# Patient Record
Sex: Female | Born: 1970 | Race: White | Hispanic: No | Marital: Married | Smoking: Never smoker
Health system: Southern US, Community
[De-identification: ages and names within clinical notes are randomized; demographics above are authoritative.]

## PROBLEM LIST (undated history)

## (undated) DIAGNOSIS — E611 Iron deficiency: Secondary | ICD-10-CM

## (undated) DIAGNOSIS — E876 Hypokalemia: Secondary | ICD-10-CM

## (undated) DIAGNOSIS — E119 Type 2 diabetes mellitus without complications: Secondary | ICD-10-CM

## (undated) HISTORY — DX: Type 2 diabetes mellitus without complications: E11.9

---

## 2019-06-08 ENCOUNTER — Encounter (HOSPITAL_COMMUNITY): Payer: Self-pay | Admitting: Emergency Medicine

## 2019-06-08 ENCOUNTER — Emergency Department (HOSPITAL_COMMUNITY): Payer: Self-pay

## 2019-06-08 ENCOUNTER — Other Ambulatory Visit: Payer: Self-pay

## 2019-06-08 ENCOUNTER — Emergency Department (HOSPITAL_COMMUNITY)
Admission: EM | Admit: 2019-06-08 | Discharge: 2019-06-08 | Disposition: A | Discharge status: Home / Self Care | Payer: Self-pay | Attending: Emergency Medicine | Admitting: Emergency Medicine

## 2019-06-08 DIAGNOSIS — R079 Chest pain, unspecified: Secondary | ICD-10-CM

## 2019-06-08 DIAGNOSIS — R0789 Other chest pain: Secondary | ICD-10-CM | POA: Insufficient documentation

## 2019-06-08 LAB — BASIC METABOLIC PANEL
Anion gap: 13 (ref 5–15)
BUN: 11 mg/dL (ref 6–20)
CO2: 21 mmol/L — ABNORMAL LOW (ref 22–32)
Calcium: 9.1 mg/dL (ref 8.9–10.3)
Chloride: 102 mmol/L (ref 98–111)
Creatinine, Ser: 0.52 mg/dL (ref 0.44–1.00)
GFR calc Af Amer: >60 mL/min (ref >60)
GFR calc non Af Amer: >60 mL/min (ref >60)
Glucose, Bld: 194 mg/dL — ABNORMAL HIGH (ref 70–99)
Potassium: 3.8 mmol/L (ref 3.5–5.1)
Sodium: 136 mmol/L (ref 135–145)

## 2019-06-08 LAB — CBC
HCT: 43.5 % (ref 36.0–46.0)
Hemoglobin: 13 g/dL (ref 12.0–15.0)
MCH: 26.6 pg (ref 26.0–34.0)
MCHC: 29.9 g/dL — ABNORMAL LOW (ref 30.0–36.0)
MCV: 89.1 fL (ref 80.0–100.0)
Platelets: 291 10*3/uL (ref 150–400)
RBC: 4.88 MIL/uL (ref 3.87–5.11)
RDW: 14.6 % (ref 11.5–15.5)
WBC: 12.5 10*3/uL — ABNORMAL HIGH (ref 4.0–10.5)
nRBC: 0 % (ref 0.0–0.2)

## 2019-06-08 LAB — HCG, SERUM, QUALITATIVE: Preg, Serum: NEGATIVE

## 2019-06-08 LAB — TROPONIN I (HIGH SENSITIVITY)
Troponin I (High Sensitivity): <2 ng/L (ref <18)
Troponin I (High Sensitivity): <2 ng/L (ref <18)

## 2019-06-08 LAB — D-DIMER, QUANTITATIVE: D-Dimer, Quant: 0.51 ug/mL-FEU — ABNORMAL HIGH (ref 0.00–0.50)

## 2019-06-08 MED ORDER — IOHEXOL 350 MG/ML SOLN
100.0000 mL | Freq: Once | INTRAVENOUS | Status: AC | PRN
  Administered 2019-06-08: 100 mL via INTRAVENOUS

## 2019-06-08 MED ORDER — NAPROXEN 250 MG PO TABS
500.0000 mg | ORAL_TABLET | Freq: Once | ORAL | Status: AC
  Administered 2019-06-08: 500 mg via ORAL
  Filled 2019-06-08: qty 2

## 2019-06-08 NOTE — Discharge Instructions (Addendum)
Recommend following up with your primary doctor to discuss the symptoms you are experiencing today.  Recommend Tylenol, Motrin as needed for pain control.  If you develop worsening chest pain, difficulty breathing, fever, vomiting, other new concerning symptom recommend return to ER for reassessment.

## 2019-06-08 NOTE — ED Triage Notes (Signed)
PT states she was walking around [place] just shortly prior to ED arrival when Middle chest pain radiating to her right arm began with SOB and increased pain when inhaling.

## 2019-06-08 NOTE — ED Provider Notes (Signed)
[facility] EMERGENCY DEPARTMENT Provider Note   CSN: [id number] Arrival date & time: 06/08/19  1806     History   Chief Complaint Chief Complaint  Patient presents with  . Chest Pain    HPI Brianna Bartlett is a 48 y.o. female.  Presents emerged from with chief complaint of chest pain.  Patient states that she was walking around [place] when she noted sudden onset of middle chest pain, radiated to her right arm.  Had shortness of breath initially associated with this.  Noted increase in pain when inhaling.  Initially pain is up to 8 out of 10 in severity, dull, ache.  States this has seemed to come and go at random since.  Pain currently 5 out of 10 in severity.  No associated shortness of breath currently.  No cough, congestion, fever, sick contacts.  Patient denies past medical history of coronary artery disease, no DVT/PE history.  Not on any medications at this time.  No hormonal therapy currently.  No long periods of immobility.     HPI  History reviewed. No pertinent past medical history.  There are no active problems to display for this patient.   History reviewed. No pertinent surgical history.   OB History    Gravida      Para      Term      Preterm      AB      Living  2     SAB      TAB      Ectopic      Multiple      Live Births               Home Medications    Prior to Admission medications   Not on File    Family History History reviewed. No pertinent family history.  Social History Social History   Tobacco Use  . Smoking status: Never Smoker  . Smokeless tobacco: Never Used  Substance Use Topics  . Alcohol use: Never    Frequency: Never  . Drug use: Never     Allergies   Aspirin   Review of Systems Review of Systems  Constitutional: Negative for chills and fever.  HENT: Negative for ear pain and sore throat.   Eyes: Negative for pain and visual disturbance.  Respiratory: Positive for shortness of breath.  Negative for cough.   Cardiovascular: Positive for chest pain. Negative for palpitations.  Gastrointestinal: Negative for abdominal pain and vomiting.  Genitourinary: Negative for dysuria and hematuria.  Musculoskeletal: Negative for arthralgias and back pain.  Skin: Negative for color change and rash.  Neurological: Negative for seizures and syncope.  All other systems reviewed and are negative.    Physical Exam Updated Vital Signs BP (!) 148/72 (BP Location: Right Arm)   Pulse (!) 107   Temp 98.8 F (37.1 C) (Oral)   Resp 18   Ht 5\' 1"  (1.549 m)   Wt 120.2 kg   SpO2 97%   BMI 50.07 kg/m   Physical Exam Vitals signs and nursing note reviewed.  Constitutional:      General: She is not in acute distress.    Appearance: She is well-developed.  HENT:     Head: Normocephalic and atraumatic.  Eyes:     Conjunctiva/sclera: Conjunctivae normal.  Neck:     Musculoskeletal: Neck supple.  Cardiovascular:     Rate and Rhythm: Normal rate and regular rhythm.     Heart sounds: No murmur.  Pulmonary:     Effort: Pulmonary effort is normal. No respiratory distress.     Breath sounds: Normal breath sounds.  Abdominal:     Palpations: Abdomen is soft.     Tenderness: There is no abdominal tenderness.  Musculoskeletal:     Right lower leg: No edema.     Left lower leg: No edema.  Skin:    General: Skin is warm and dry.     Capillary Refill: Capillary refill takes less than 2 seconds.  Neurological:     General: No focal deficit present.     Mental Status: She is alert and oriented to person, place, and time.      ED Treatments / Results  Labs (all labs ordered are listed, but only abnormal results are displayed) Labs Reviewed  BASIC METABOLIC PANEL  CBC  D-DIMER, QUANTITATIVE (NOT AT [facility])  TROPONIN I (HIGH SENSITIVITY)  TROPONIN I (HIGH SENSITIVITY)    EKG EKG Interpretation  Date/Time:  Sunday June 08 2019 18:13:13 EST Ventricular Rate:  109 PR Interval:   138 QRS Duration: 88 QT Interval:  348 QTC Calculation: 468 R Axis:   25 Text Interpretation: Sinus tachycardia Cannot rule out Anterior infarct , age undetermined Abnormal ECG Confirmed by Shadow Stiggers ([id number]) on 06/08/2019 7:22:07 PM   Radiology Dg Chest 2 View  Result Date: 06/08/2019 CLINICAL DATA:  Chest pain EXAM: CHEST - 2 VIEW COMPARISON:  None. FINDINGS: The heart size and mediastinal contours are within normal limits. Both lungs are clear. The visualized skeletal structures are unremarkable. IMPRESSION: No active cardiopulmonary disease. Electronically Signed   By: David  Williams III M.D   On: 06/08/2019 19:04    Procedures Procedures (including critical care time)  Medications Ordered in ED Medications  naproxen (NAPROSYN) tablet 500 mg (500 mg Oral Given 06/08/19 2026)     Initial Impression / Assessment and Plan / ED Course  I have reviewed the triage vital signs and the nursing notes.  Pertinent labs & imaging results that were available during my care of the patient were reviewed by me and considered in my medical decision making (see chart for details).  Clinical Course as of Jun 08 16  Sun Jun 08, 2019  2259 Updated patient, remains well appearing, currently asymptomatic will dc home   [RD]    Clinical Course User Index [RD] Shawnae Leiva S, MD       48  year old lady presents to ER after sudden onset of chest pain or difficulty breathing while at [facility] today.  Here patient was well-appearing and her symptoms had improved significantly spontaneously.  Initial triage vitals demonstrated mild tachycardia.  EKG without ischemic changes.  Troponin x2 within normal limits, undetectable.  Chest x-ray negative.  Her dimer was slightly elevated therefore check CTA chest to further evaluate.  CTA chest negative for pulmonary embolism.  On reassessment patient asymptomatic with normal vital signs.  Will discharge home.  Recommend recheck with primary doctor.     After the discussed management above, the patient was determined to be safe for discharge.  The patient was in agreement with this plan and all questions regarding their care were answered.  ED return precautions were discussed and the patient will return to the ED with any significant worsening of condition.    Final Clinical Impressions(s) / ED Diagnoses   Final diagnoses:  Chest pain, unspecified type    ED Discharge Orders    None       [facility], MD 06/09/19  0018  

## 2021-03-20 ENCOUNTER — Emergency Department (HOSPITAL_COMMUNITY)
Admission: EM | Admit: 2021-03-20 | Discharge: 2021-03-21 | Disposition: A | Discharge status: Home / Self Care | Payer: Self-pay | Attending: Emergency Medicine | Admitting: Emergency Medicine

## 2021-03-20 ENCOUNTER — Encounter (HOSPITAL_COMMUNITY): Payer: Self-pay

## 2021-03-20 ENCOUNTER — Other Ambulatory Visit: Payer: Self-pay

## 2021-03-20 DIAGNOSIS — M545 Low back pain, unspecified: Secondary | ICD-10-CM

## 2021-03-20 DIAGNOSIS — Y9389 Activity, other specified: Secondary | ICD-10-CM | POA: Insufficient documentation

## 2021-03-20 DIAGNOSIS — Y92019 Unspecified place in single-family (private) house as the place of occurrence of the external cause: Secondary | ICD-10-CM | POA: Insufficient documentation

## 2021-03-20 DIAGNOSIS — X500XXA Overexertion from strenuous movement or load, initial encounter: Secondary | ICD-10-CM | POA: Insufficient documentation

## 2021-03-20 HISTORY — DX: Iron deficiency: E61.1

## 2021-03-20 HISTORY — DX: Hypokalemia: E87.6

## 2021-03-20 NOTE — ED Triage Notes (Addendum)
Pt. States "threw my back out and can hardly walk". Pt. States they were moving furniture, four days ago, when they injured their back.

## 2021-03-21 ENCOUNTER — Emergency Department (HOSPITAL_COMMUNITY): Payer: Self-pay

## 2021-03-21 MED ORDER — MELOXICAM 7.5 MG PO TABS: 7.5000 mg | ORAL_TABLET | Freq: Every day | ORAL | 0 refills | Status: DC

## 2021-03-21 MED ORDER — DIAZEPAM 5 MG PO TABS: 2.5000 mg | ORAL_TABLET | Freq: Three times a day (TID) | ORAL | 0 refills | Status: DC | PRN

## 2021-03-21 MED ORDER — LIDOCAINE 5 % EX PTCH
1.0000 | MEDICATED_PATCH | CUTANEOUS | Status: DC
  Administered 2021-03-21: 1 via TRANSDERMAL
  Filled 2021-03-21: qty 1

## 2021-03-21 MED ORDER — DIAZEPAM 2 MG PO TABS
2.0000 mg | ORAL_TABLET | Freq: Once | ORAL | Status: AC
  Administered 2021-03-21: 2 mg via ORAL
  Filled 2021-03-21: qty 1

## 2021-03-21 MED ORDER — KETOROLAC TROMETHAMINE 60 MG/2ML IM SOLN
60.0000 mg | Freq: Once | INTRAMUSCULAR | Status: AC
  Administered 2021-03-21: 60 mg via INTRAMUSCULAR
  Filled 2021-03-21: qty 2

## 2021-03-21 NOTE — ED Provider Notes (Signed)
[facility] EMERGENCY DEPARTMENT Provider Note   CSN: [id number] Arrival date & time: 03/20/21  2216     History Chief Complaint  Patient presents with   Back Pain    Brianna Bartlett is a 50 y.o. female.  50 year old female presents emerged from today with back pain.  Patient states about 3 or 4 days ago she was moving furniture around her house which is abnormal for her.  She states that the next day she woke up with left-sided back pain.  She states it is "locked up" and she has difficulty walking because of it.  No urinary incontinence.  No trauma.  No history of IV drug use, fevers or neurologic changes. No urinary or vaginal symptoms. No GI symptoms.    Back Pain     Past Medical History:  Diagnosis Date   Iron deficiency    Potassium deficiency     There are no problems to display for this patient.   History reviewed. No pertinent surgical history.   OB History     Gravida      Para      Term      Preterm      AB      Living  2      SAB      IAB      Ectopic      Multiple      Live Births              History reviewed. No pertinent family history.  Social History   Tobacco Use   Smoking status: Never   Smokeless tobacco: Never  Vaping Use   Vaping Use: Never used  Substance Use Topics   Alcohol use: Never   Drug use: Never    Home Medications Prior to Admission medications   Medication Sig Start Date End Date Taking? Authorizing Provider  diazepam (VALIUM) 5 MG tablet Take 0.5 tablets (2.5 mg total) by mouth every 8 (eight) hours as needed (spasms). 03/21/21  Yes Tekia Waterbury, Barbara Cower, MD  meloxicam (MOBIC) 7.5 MG tablet Take 1 tablet (7.5 mg total) by mouth daily. 03/21/21  Yes Kaelyn Nauta, Barbara Cower, MD    Allergies    Aspirin  Review of Systems   Review of Systems  Musculoskeletal:  Positive for back pain.  All other systems reviewed and are negative.  Physical Exam Updated Vital Signs BP (!) 159/67 (BP Location: Right Arm)    Pulse (!) 101   Temp 99.2 F (37.3 C) (Oral)   Resp 18   Ht 5\' 1"  (1.549 m)   Wt 111.1 kg   SpO2 94%   BMI 46.29 kg/m   Physical Exam Vitals and nursing note reviewed.  Constitutional:      Appearance: She is well-developed.  HENT:     Head: Normocephalic and atraumatic.     Mouth/Throat:     Mouth: Mucous membranes are moist.     Pharynx: Oropharynx is clear.  Cardiovascular:     Rate and Rhythm: Normal rate and regular rhythm.  Pulmonary:     Effort: No respiratory distress.     Breath sounds: No stridor.  Abdominal:     General: Abdomen is flat. There is no distension.  Musculoskeletal:        General: Tenderness (left lumbar paraspinal) present. Normal range of motion.     Cervical back: Normal range of motion.  Skin:    General: Skin is warm and dry.  Neurological:  General: No focal deficit present.     Mental Status: She is alert.     Comments: No altered mental status, able to give full seemingly accurate history.  Face is symmetric, EOM's intact, pupils equal and reactive, vision intact, tongue and uvula midline without deviation. Upper and Lower extremity motor 5/5, intact pain perception in distal extremities, 2+ reflexes in biceps, patella and achilles tendons. Able to perform finger to nose normal with both hands. Antalgic gait without assistance or evident ataxia.      ED Results / Procedures / Treatments   Labs (all labs ordered are listed, but only abnormal results are displayed) Labs Reviewed - No data to display  EKG None  Radiology DG Lumbar Spine 2-3 Views  Result Date: 03/21/2021 CLINICAL DATA:  Low back pain following strenuous activity, initial encounter EXAM: LUMBAR SPINE - 3 VIEW COMPARISON:  None. FINDINGS: Five lumbar type vertebral bodies are well visualized. Mild scoliosis concave to the left is noted. Vertebral body height is well maintained. No anterolisthesis is seen. Mild disc space narrowing is noted at L4-5 and L5-S1. Mild  osteophytic changes are noted as well. No soft tissue abnormality is seen. IMPRESSION: Mild degenerative change without acute abnormality Electronically Signed   By: Alcide Clever M.D.   On: 03/21/2021 01:09   DG Pelvis 1-2 Views  Result Date: 03/21/2021 CLINICAL DATA:  Pelvic pain following strenuous exercise, initial encounter EXAM: PELVIS - 1 VIEW COMPARISON:  None. FINDINGS: Pelvic ring is intact. Mild degenerative changes of the hip joints are noted bilaterally. No acute fracture or dislocation is seen. IMPRESSION: No acute abnormality noted. Mild degenerative changes of the hips are seen. Electronically Signed   By: Alcide Clever M.D.   On: 03/21/2021 01:07    Procedures Procedures   Medications Ordered in ED Medications  lidocaine (LIDODERM) 5 % 1 patch (1 patch Transdermal Patch Applied 03/21/21 0033)  diazepam (VALIUM) tablet 2 mg (2 mg Oral Given 03/21/21 0032)  ketorolac (TORADOL) injection 60 mg (60 mg Intramuscular Given 03/21/21 0032)    ED Course  I have reviewed the triage vital signs and the nursing notes.  Pertinent labs & imaging results that were available during my care of the patient were reviewed by me and considered in my medical decision making (see chart for details).    MDM Rules/Calculators/A&P                           No red flags for abscess, fracture, cauda equina requiring further workup. Pain almost fully improved with spasm treatment. Will dc on same.   Final Clinical Impression(s) / ED Diagnoses Final diagnoses:  Acute left-sided low back pain without sciatica    Rx / DC Orders ED Discharge Orders          Ordered    diazepam (VALIUM) 5 MG tablet  Every 8 hours PRN        03/21/21 0133    meloxicam (MOBIC) 7.5 MG tablet  Daily        03/21/21 0133             Justen Fonda, Barbara Cower, MD 03/21/21 [phone]

## 2021-03-21 NOTE — ED Notes (Signed)
Patient transported to X-ray 

## 2021-06-27 ENCOUNTER — Ambulatory Visit: Payer: Self-pay | Admitting: Nurse Practitioner

## 2021-07-12 ENCOUNTER — Ambulatory Visit: Payer: Self-pay | Admitting: Nurse Practitioner

## 2022-02-17 IMAGING — DX DG PELVIS 1-2V
1 series · 1 of 1 positions shown · non-contrast
Comparison: None.

CLINICAL DATA: Pelvic pain following strenuous exercise, initial
encounter

EXAM:
PELVIS - 1 VIEW

[pelvis ap]
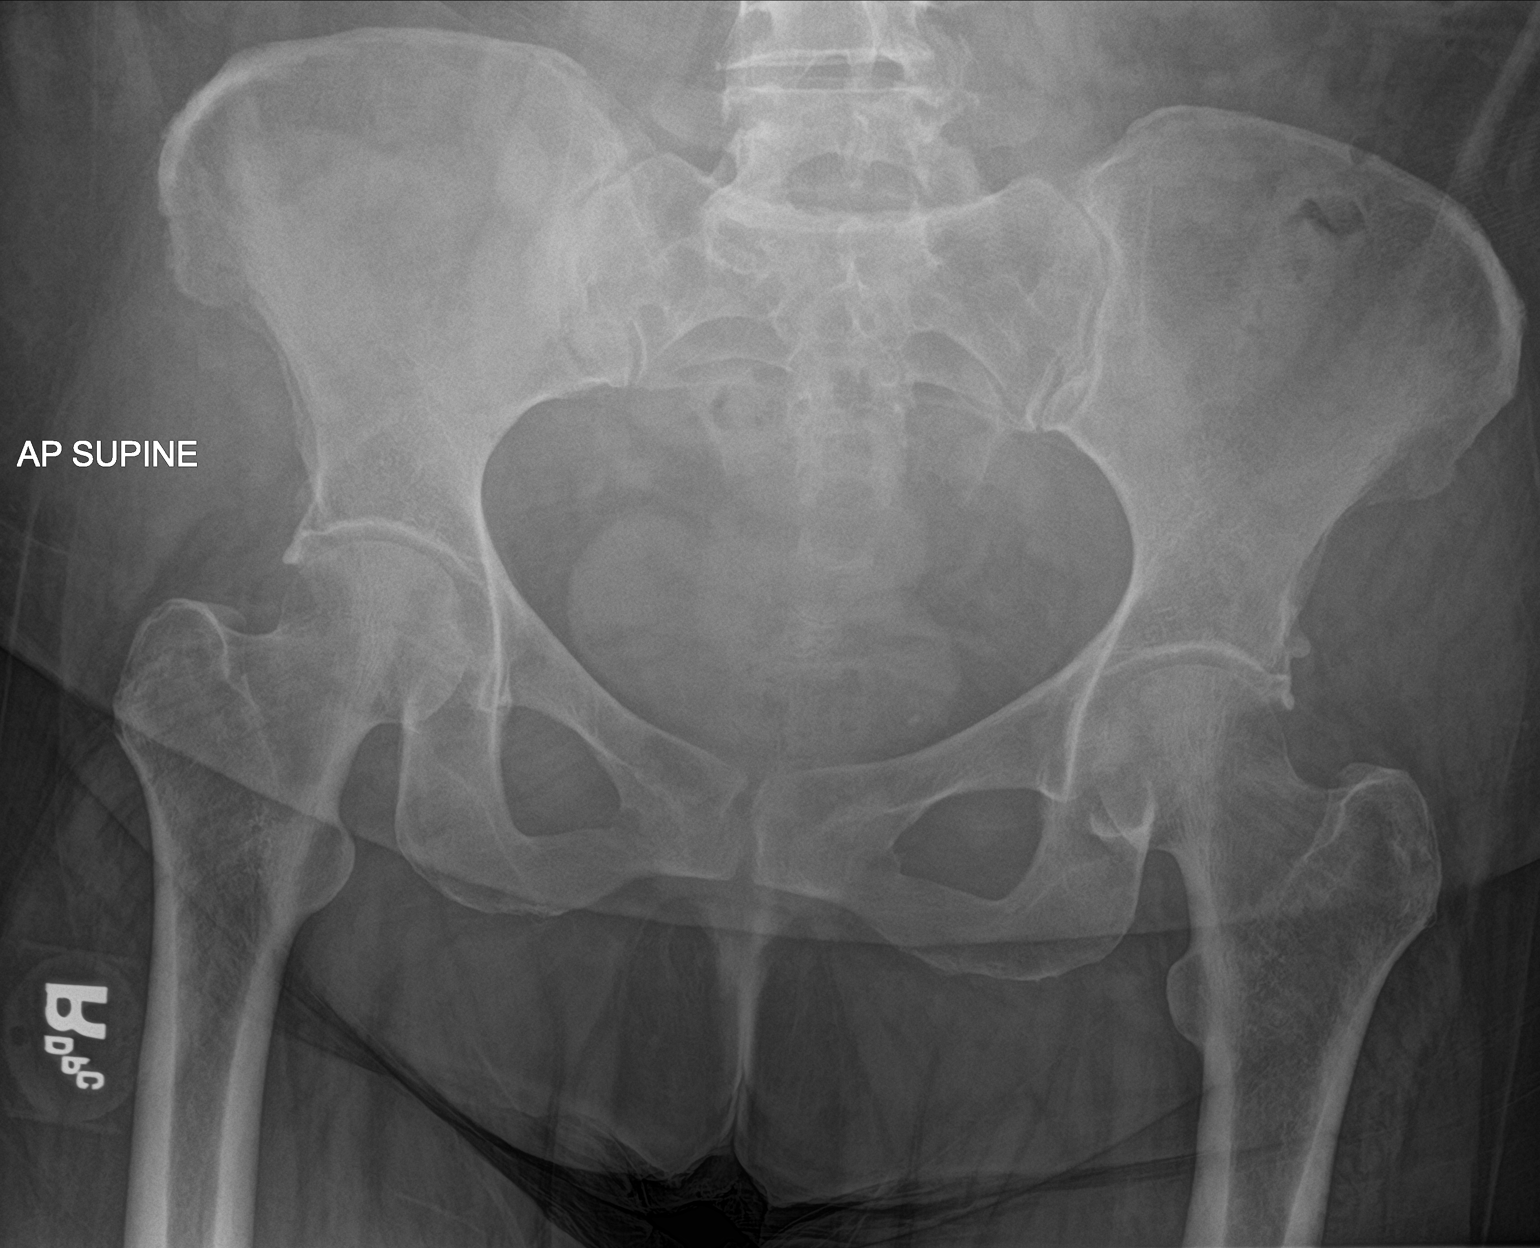

[1 of 1 positions shown; findings below may reference images not displayed]

FINDINGS: Pelvic ring is intact. Mild degenerative changes of the hip joints
are noted bilaterally. No acute fracture or dislocation is seen.
IMPRESSION: No acute abnormality noted. Mild degenerative changes of the hips
are seen.

## 2022-03-29 ENCOUNTER — Ambulatory Visit
Admission: RE | Admit: 2022-03-29 | Discharge: 2022-03-29 | Disposition: A | Discharge status: Home / Self Care | Payer: Self-pay | Source: Ambulatory Visit | Attending: Nurse Practitioner | Admitting: Nurse Practitioner

## 2022-03-29 ENCOUNTER — Ambulatory Visit (INDEPENDENT_AMBULATORY_CARE_PROVIDER_SITE_OTHER): Payer: Self-pay

## 2022-03-29 ENCOUNTER — Other Ambulatory Visit: Payer: Self-pay

## 2022-03-29 VITALS — BP 142/85 | HR 109 | Temp 97.9°F | Resp 20

## 2022-03-29 DIAGNOSIS — M25521 Pain in right elbow: Secondary | ICD-10-CM

## 2022-03-29 DIAGNOSIS — W19XXXA Unspecified fall, initial encounter: Secondary | ICD-10-CM

## 2022-03-29 DIAGNOSIS — F410 Panic disorder [episodic paroxysmal anxiety] without agoraphobia: Secondary | ICD-10-CM

## 2022-03-29 HISTORY — DX: Type 2 diabetes mellitus without complications: E11.9

## 2022-03-29 MED ORDER — ACETAMINOPHEN 500 MG PO TABS: 1000.0000 mg | ORAL_TABLET | Freq: Four times a day (QID) | ORAL | 0 refills | Status: AC | PRN

## 2022-03-29 MED ORDER — HYDROXYZINE HCL 10 MG PO TABS: 10.0000 mg | ORAL_TABLET | Freq: Three times a day (TID) | ORAL | 0 refills | Status: AC | PRN

## 2022-03-29 NOTE — ED Provider Notes (Signed)
[facility]    CSN: [id number] Arrival date & time: 03/29/22  1556      History   Chief Complaint Chief Complaint  Patient presents with   Elbow Pain    HPI Brianna Bartlett is a 51 y.o. female.   Patient presents for right forearm/elbow pain that began about 1 week ago after she fell down 2 stairs and landed on her right elbow.  She initially reports swelling that has now improved somewhat.  Reports anytime she moves her right arm, it is painful.  Denies any numbness or tingling in her fingertips, skin discoloration or changes.  No fevers or nausea/vomiting since the injury.  Has not taken anything for the pain so far.  Patient also reports concern about worsening anxiety attacks.  Reports difficulty sleeping at night.  Also reports longstanding history of anxiety.  Reports she was on a medication as a teen that made her stare at 1 spot on the ceiling, so she stopped taking it.  She is currently looking for a primary care provider.     Past Medical History:  Diagnosis Date   Diabetes mellitus without complication (HCC)    Iron deficiency    Potassium deficiency     There are no problems to display for this patient.   History reviewed. No pertinent surgical history.  OB History     Gravida      Para      Term      Preterm      AB      Living  2      SAB      IAB      Ectopic      Multiple      Live Births               Home Medications    Prior to Admission medications   Medication Sig Start Date End Date Taking? Authorizing Provider  acetaminophen (TYLENOL) 500 MG tablet Take 2 tablets (1,000 mg total) by mouth every 6 (six) hours as needed for moderate pain. 03/29/22  Yes Valentino Nose, NP  hydrOXYzine (ATARAX) 10 MG tablet Take 1 tablet (10 mg total) by mouth every 8 (eight) hours as needed for anxiety. Do not take with alcohol or while driving or operating heavy machinery 03/29/22  Yes Valentino Nose, NP    Family  History History reviewed. No pertinent family history.  Social History Social History   Tobacco Use   Smoking status: Never   Smokeless tobacco: Never  Vaping Use   Vaping Use: Never used  Substance Use Topics   Alcohol use: Never   Drug use: Never     Allergies   Aspirin   Review of Systems Review of Systems Per HPI  Physical Exam Triage Vital Signs ED Triage Vitals  Enc Vitals Group     BP 03/29/22 1617 (!) 142/85     Pulse Rate 03/29/22 1617 (!) 109     Resp 03/29/22 1617 20     Temp 03/29/22 1617 97.9 F (36.6 C)     Temp Source 03/29/22 1617 Oral     SpO2 03/29/22 1617 96 %     Weight --      Height --      Head Circumference --      Peak Flow --      Pain Score 03/29/22 1618 9     Pain Loc --      Pain Edu? --  Excl. in GC? --    No data found.  Updated Vital Signs BP (!) 142/85 (BP Location: Left Arm)   Pulse (!) 109   Temp 97.9 F (36.6 C) (Oral)   Resp 20   SpO2 96%   Visual Acuity Right Eye Distance:   Left Eye Distance:   Bilateral Distance:    Right Eye Near:   Left Eye Near:    Bilateral Near:     Physical Exam Vitals and nursing note reviewed.  Constitutional:      General: She is not in acute distress.    Appearance: Normal appearance. She is not toxic-appearing.  HENT:     Head: Normocephalic and atraumatic.     Mouth/Throat:     Mouth: Mucous membranes are moist.     Pharynx: Oropharynx is clear.  Pulmonary:     Effort: Pulmonary effort is normal. No respiratory distress.  Musculoskeletal:     Right elbow: No swelling, deformity or effusion. Normal range of motion. Tenderness present in lateral epicondyle and olecranon process.     Left elbow: Normal.     Right forearm: Tenderness present. No swelling, edema or bony tenderness.     Right wrist: No swelling, tenderness, bony tenderness or snuff box tenderness. Normal range of motion. Normal pulse.     Left wrist: Normal.     Right hand: No swelling or bony  tenderness. Normal range of motion. Normal strength. Normal sensation. There is no disruption of two-point discrimination. Normal capillary refill. Normal pulse.       Arms:     Comments: Inspection: no swelling, obvious deformity or redness to right upper extremity Palpation: right upper extremity tender to palpation in approximately areas marked; no obvious deformities palpated ROM: Full passive ROM to right upper extremity; ROM is painful.   Strength: 5/5 bilateral upper extremities Neurovascular: neurovascularly intact in left and right upper extremity   Skin:    General: Skin is warm and dry.     Capillary Refill: Capillary refill takes less than 2 seconds.     Coloration: Skin is not jaundiced or pale.     Findings: No erythema or rash.  Neurological:     Mental Status: She is alert and oriented to person, place, and time.  Psychiatric:        Behavior: Behavior is cooperative.      UC Treatments / Results  Labs (all labs ordered are listed, but only abnormal results are displayed) Labs Reviewed - No data to display  EKG   Radiology DG ELBOW COMPLETE RIGHT (3+VIEW)  Result Date: 03/29/2022 CLINICAL DATA:  Right elbow pain, fall EXAM: RIGHT ELBOW - COMPLETE 3+ VIEW COMPARISON:  None Available. FINDINGS: No cortical discontinuity to indicate fracture. No elbow joint effusion. Minimally anterior bulging supinator fat pad, but not proximally effaced. IMPRESSION: 1. No discrete fracture or elbow joint effusion identified. 2. The supinator fat pad has some volar bulging but does not appear effaced or displaced proximally, and accordingly is not considered definitively abnormal. Electronically Signed   By: Gaylyn Rong M.D.   On: 03/29/2022 16:30    Procedures Procedures (including critical care time)  Medications Ordered in UC Medications - No data to display  Initial Impression / Assessment and Plan / UC Course  I have reviewed the triage vital signs and the nursing  notes.  Pertinent labs & imaging results that were available during my care of the patient were reviewed by me and considered in my medical decision  making (see chart for details).    Patient is a very pleasant, well-appearing 51 year old female presenting for right upper extremity pain after a fall about 1 week ago.  In triage, she is slightly tachycardic and slightly hypertensive, however oxygenating well on room air, not tachypneic, and afebrile.  She appears slightly nervous.  X-ray imaging of the right elbow does not definitively show acute bony fracture.  Discussed findings with patient.  Ace wrap applied, discussed ice, elevation, and use of Tylenol 500 to 1000 mg every 6 hours as needed for pain.  Contact information given for orthopedic provider if symptoms do not improve with this treatment.  Regarding anxiety/panic attacks, recommended starting hydroxyzine 10 mg every hours as needed for anxiety.  No SI/HI today.  Initiated primary care assistance per patient request.  The patient was given the opportunity to ask questions.  All questions answered to their satisfaction.  The patient is in agreement to this plan.   Final Clinical Impressions(s) / UC Diagnoses   Final diagnoses:  Right elbow pain  Panic attacks     Discharge Instructions      - The elbow x-ray today does not show any broken bones in your right elbow -Please rest your arm, try to keep it elevated, and use an ice pack to help with pain/swelling.  You can also use Tylenol 500 to 1000 mg every 6 hours as needed for pain. -If pain has not improved over the next week, please follow-up with orthopedic provider.  Contact information is provided.   - Regarding the anxiety/panic attacks, you can use hydroxyzine 1 tablet every 8 hours as needed for anxiety. -We will reach out to you with next steps for establishing care with a primary care provider.   ED Prescriptions     Medication Sig Dispense Auth. Provider    hydrOXYzine (ATARAX) 10 MG tablet Take 1 tablet (10 mg total) by mouth every 8 (eight) hours as needed for anxiety. Do not take with alcohol or while driving or operating heavy machinery 30 tablet Noemi Chapel A, NP   acetaminophen (TYLENOL) 500 MG tablet Take 2 tablets (1,000 mg total) by mouth every 6 (six) hours as needed for moderate pain. 30 tablet Eulogio Bear, NP      PDMP not reviewed this encounter.   Eulogio Bear, NP 03/29/22 Bosie Helper

## 2022-03-29 NOTE — ED Triage Notes (Signed)
Pt reports dizziness x1 week ago and reports fell and landed on right elbow. Pt reports right upper forearm pain that radiates to right elbow. Limited ROM noted in triage.   No obvious deformity noted.   Pt reports recently has also had increasingly worsening anxiety attacks.

## 2022-03-29 NOTE — Discharge Instructions (Addendum)
-   The elbow x-ray today does not show any broken bones in your right elbow -Please rest your arm, try to keep it elevated, and use an ice pack to help with pain/swelling.  You can also use Tylenol 500 to 1000 mg every 6 hours as needed for pain. -If pain has not improved over the next week, please follow-up with orthopedic provider.  Contact information is provided.   - Regarding the anxiety/panic attacks, you can use hydroxyzine 1 tablet every 8 hours as needed for anxiety. -We will reach out to you with next steps for establishing care with a primary care provider.

## 2023-04-16 ENCOUNTER — Encounter (HOSPITAL_COMMUNITY): Payer: Self-pay | Admitting: Emergency Medicine

## 2023-04-16 ENCOUNTER — Other Ambulatory Visit: Payer: Self-pay

## 2023-04-16 ENCOUNTER — Emergency Department (HOSPITAL_COMMUNITY)
Admission: EM | Admit: 2023-04-16 | Discharge: 2023-04-17 | Disposition: A | Discharge status: Home / Self Care | Payer: Self-pay | Attending: Emergency Medicine | Admitting: Emergency Medicine

## 2023-04-16 DIAGNOSIS — R531 Weakness: Secondary | ICD-10-CM | POA: Insufficient documentation

## 2023-04-16 DIAGNOSIS — E1165 Type 2 diabetes mellitus with hyperglycemia: Secondary | ICD-10-CM | POA: Insufficient documentation

## 2023-04-16 DIAGNOSIS — R739 Hyperglycemia, unspecified: Secondary | ICD-10-CM

## 2023-04-16 DIAGNOSIS — R42 Dizziness and giddiness: Secondary | ICD-10-CM | POA: Insufficient documentation

## 2023-04-16 DIAGNOSIS — R0789 Other chest pain: Secondary | ICD-10-CM | POA: Insufficient documentation

## 2023-04-16 DIAGNOSIS — Z7984 Long term (current) use of oral hypoglycemic drugs: Secondary | ICD-10-CM | POA: Insufficient documentation

## 2023-04-16 LAB — CBC
HCT: 41.6 % (ref 36.0–46.0)
Hemoglobin: 13.4 g/dL (ref 12.0–15.0)
MCH: 28 pg (ref 26.0–34.0)
MCHC: 32.2 g/dL (ref 30.0–36.0)
MCV: 86.8 fL (ref 80.0–100.0)
Platelets: 349 10*3/uL (ref 150–400)
RBC: 4.79 MIL/uL (ref 3.87–5.11)
RDW: 13.3 % (ref 11.5–15.5)
WBC: 11 10*3/uL — ABNORMAL HIGH (ref 4.0–10.5)
nRBC: 0 % (ref 0.0–0.2)

## 2023-04-16 LAB — BASIC METABOLIC PANEL
Anion gap: 10 (ref 5–15)
BUN: 10 mg/dL (ref 6–20)
CO2: 22 mmol/L (ref 22–32)
Calcium: 8.8 mg/dL — ABNORMAL LOW (ref 8.9–10.3)
Chloride: 101 mmol/L (ref 98–111)
Creatinine, Ser: 0.56 mg/dL (ref 0.44–1.00)
GFR, Estimated: >60 mL/min (ref >60)
Glucose, Bld: 250 mg/dL — ABNORMAL HIGH (ref 70–99)
Potassium: 3.4 mmol/L — ABNORMAL LOW (ref 3.5–5.1)
Sodium: 133 mmol/L — ABNORMAL LOW (ref 135–145)

## 2023-04-16 LAB — CBG MONITORING, ED: Glucose-Capillary: 248 mg/dL — ABNORMAL HIGH (ref 70–99)

## 2023-04-16 NOTE — ED Provider Notes (Signed)
[place] [facility]MENT AT [facility]  Provider Note  CSN: [id number] Arrival date & time: 04/16/23 1825  History Chief Complaint  Patient presents with   Dizziness    Brianna Bartlett is a 52 y.o. female with history of diabetes, not on any medications, managed with diet only but rarely checks her CBG or A1C. She reports she was feeling dizzy around 11am, non specific, but similar to multiple prior episodes she reports having twice a week. Usually if she lies down, the symptoms improve so she went to take a nap around noon and woke up at 4pm still feeling poorly. She called her husband who came home early from work and was planning to bring her to the ED when she began complaining of generalized weakness and severe pain all over. She was apparently unable to move any extremities or get out of bed. EMS was called and she was brought to the ED for evaluation. She has improved since arrival and is asymptomatic now. She reports on of the areas that was hurting her was her chest and she was having trouble breathing.   Home Medications Prior to Admission medications   Medication Sig Start Date End Date Taking? Authorizing Provider  metFORMIN (GLUCOPHAGE) 500 MG tablet Take 1 tablet (500 mg total) by mouth 2 (two) times daily with a meal. 04/17/23  Yes Pollyann Savoy, MD  acetaminophen (TYLENOL) 500 MG tablet Take 2 tablets (1,000 mg total) by mouth every 6 (six) hours as needed for moderate pain. 03/29/22   Valentino Nose, NP  hydrOXYzine (ATARAX) 10 MG tablet Take 1 tablet (10 mg total) by mouth every 8 (eight) hours as needed for anxiety. Do not take with alcohol or while driving or operating heavy machinery 03/29/22   Valentino Nose, NP     Allergies    Aspirin   Review of Systems   Review of Systems Please see HPI for pertinent positives and negatives  Physical Exam BP 131/73   Pulse 97   Temp 98.8 F (37.1 C) (Oral)   Resp (!) 22   Ht 5\' 1"  (1.549 m)    Wt 111.1 kg   SpO2 95%   BMI 46.28 kg/m   Physical Exam Vitals and nursing note reviewed.  Constitutional:      Appearance: Normal appearance.  HENT:     Head: Normocephalic and atraumatic.     Right Ear: Tympanic membrane normal.     Left Ear: Tympanic membrane normal.     Nose: Nose normal.     Mouth/Throat:     Mouth: Mucous membranes are moist.  Eyes:     Extraocular Movements: Extraocular movements intact.     Conjunctiva/sclera: Conjunctivae normal.  Cardiovascular:     Rate and Rhythm: Normal rate.  Pulmonary:     Effort: Pulmonary effort is normal.     Breath sounds: Normal breath sounds.  Abdominal:     General: Abdomen is flat.     Palpations: Abdomen is soft.     Tenderness: There is no abdominal tenderness. There is no guarding.  Musculoskeletal:        General: No swelling. Normal range of motion.     Cervical back: Neck supple.  Skin:    General: Skin is warm and dry.  Neurological:     General: No focal deficit present.     Mental Status: She is alert.  Psychiatric:        Mood and Affect: Mood normal.  ED Results / Procedures / Treatments   EKG EKG Interpretation Date/Time:  Monday April 16 2023 23:35:58 EDT Ventricular Rate:  103 PR Interval:  141 QRS Duration:  103 QT Interval:  360 QTC Calculation: 472 R Axis:   42  Text Interpretation: Sinus tachycardia Low voltage, precordial leads Probable anteroseptal infarct, old No significant change since last tracing Confirmed by Susy Frizzle [id number]) on 04/16/2023 11:43:09 PM  Procedures Procedures  Medications Ordered in the ED Medications - No data to display  Initial Impression and Plan  Patient here with nonspecific dizziness which seems to be a frequent occurrence for her, but today was more severe and associated with an episode of global weakness and diffuse body pain. She is back to baseline now, exam is normal. No concern for CVA as no reported focal deficit. She had labs done  in triage with unremarkable CBC, BMP with mild hyperglycemia without acidosis. EKG is not ischemic, will add Trop. Suspect her symptoms are a non-lifethreatening self-limited process, possibly related to her poorly controlled DM. She does not have any other significant cardiac risk factors and does not have any anginal symptoms today.   ED Course   Clinical Course as of 04/17/23 0014  Tue Apr 17, 2023  0012 Trop is negative. Will discharge home as above with Rx for Metformin to treat her hyperglycemia, recommend close outpatient PCP follow up for long term management. RTED for any other concerns.  [CS]    Clinical Course User Index [CS] Pollyann Savoy, MD     MDM Rules/Calculators/A&P Medical Decision Making Problems Addressed: Atypical chest pain: acute illness or injury Dizziness: acute illness or injury Hyperglycemia: acute illness or injury  Amount and/or Complexity of Data Reviewed Labs: ordered. Decision-making details documented in ED Course. ECG/medicine tests: ordered and independent interpretation performed. Decision-making details documented in ED Course.  Risk Prescription drug management.     Final Clinical Impression(s) / ED Diagnoses Final diagnoses:  Dizziness  Atypical chest pain  Hyperglycemia    Rx / DC Orders ED Discharge Orders          Ordered    metFORMIN (GLUCOPHAGE) 500 MG tablet  2 times daily with meals        04/17/23 0014             Pollyann Savoy, MD 04/17/23 [phone]

## 2023-04-16 NOTE — ED Notes (Signed)
Unable to provide a urine sample at this time.

## 2023-04-16 NOTE — ED Triage Notes (Signed)
Pt BIB [facility] for evaluation of dizziness and CP that started per pt around 11 am, this morning.

## 2023-04-16 NOTE — ED Notes (Signed)
Pt ambulated to the bathroom without assistance for urine sample.

## 2023-04-16 NOTE — ED Notes (Signed)
Pt says she cannot go due to not having anything to drink in the past 12 hours. Urine cup was given and told to press call light when she had to go.

## 2023-04-17 LAB — TROPONIN I (HIGH SENSITIVITY): Troponin I (High Sensitivity): 2 ng/L (ref <18)

## 2023-04-17 MED ORDER — METFORMIN HCL 500 MG PO TABS: 500.0000 mg | ORAL_TABLET | Freq: Two times a day (BID) | ORAL | 0 refills | Status: AC

## 2023-09-03 ENCOUNTER — Ambulatory Visit
Admission: EM | Admit: 2023-09-03 | Discharge: 2023-09-03 | Disposition: A | Discharge status: Home / Self Care | Payer: Self-pay | Attending: Nurse Practitioner | Admitting: Nurse Practitioner

## 2023-09-03 DIAGNOSIS — J069 Acute upper respiratory infection, unspecified: Secondary | ICD-10-CM

## 2023-09-03 LAB — POCT INFLUENZA A/B
Influenza A, POC: NEGATIVE
Influenza B, POC: NEGATIVE

## 2023-09-03 LAB — POCT RAPID STREP A (OFFICE): Rapid Strep A Screen: NEGATIVE

## 2023-09-03 MED ORDER — BENZONATATE 100 MG PO CAPS: 100.0000 mg | ORAL_CAPSULE | Freq: Three times a day (TID) | ORAL | 0 refills | Status: AC | PRN

## 2023-09-03 NOTE — Discharge Instructions (Addendum)
You have a viral upper respiratory infection.  Symptoms should improve over the next week to 10 days.  If you develop chest pain or shortness of breath, go to the emergency room.  Rapid strep throat test and influenza test is negative today.  Some things that can make you feel better are: - Increased rest - Increasing fluid with water/sugar free electrolytes - Acetaminophen and ibuprofen as needed for fever/pain - Salt water gargling, chloraseptic spray and throat lozenges - OTC guaifenesin (Mucinex) 600 mg twice daily for congestion - Saline sinus flushes or a neti pot - Humidifying the air -Tessalon Perles every 8 hours as needed for dry cough

## 2023-09-03 NOTE — ED Triage Notes (Signed)
Pt reports hoarseness, sore throat, cough, congestion, fever, headache x 3 days

## 2023-09-03 NOTE — ED Provider Notes (Signed)
[facility]    CSN: [id number] Arrival date & time: 09/03/23  1543      History   Chief Complaint No chief complaint on file.   HPI Brianna Bartlett is a 53 y.o. female.   Patient presents today with 3-day history of tactile fever, body aches, congested cough, runny and stuffy nose, sore throat, headache, decreased appetite, and fatigue.  Patient denies shortness of breath or chest pain, ear pain, abdominal pain, nausea/vomiting, and diarrhea.  Female caregiver present in room states entire family has been sick with similar symptoms.  Has been taking NyQuil at nighttime and Hall's cough drops for symptoms without relief.    Past Medical History:  Diagnosis Date   Diabetes mellitus without complication (HCC)    Iron deficiency    Potassium deficiency     There are no active problems to display for this patient.   History reviewed. No pertinent surgical history.  OB History     Gravida      Para      Term      Preterm      AB      Living  2      SAB      IAB      Ectopic      Multiple      Live Births               Home Medications    Prior to Admission medications   Medication Sig Start Date End Date Taking? Authorizing Provider  benzonatate (TESSALON) 100 MG capsule Take 1 capsule (100 mg total) by mouth 3 (three) times daily as needed for cough. Do not take with alcohol or while operating or driving heavy machinery 11/14/79  Yes Valentino Nose, NP  acetaminophen (TYLENOL) 500 MG tablet Take 2 tablets (1,000 mg total) by mouth every 6 (six) hours as needed for moderate pain. 03/29/22   Valentino Nose, NP  hydrOXYzine (ATARAX) 10 MG tablet Take 1 tablet (10 mg total) by mouth every 8 (eight) hours as needed for anxiety. Do not take with alcohol or while driving or operating heavy machinery 03/29/22   Valentino Nose, NP  metFORMIN (GLUCOPHAGE) 500 MG tablet Take 1 tablet (500 mg total) by mouth 2 (two) times daily with a meal.  04/17/23   Pollyann Savoy, MD    Family History History reviewed. No pertinent family history.  Social History Social History   Tobacco Use   Smoking status: Never   Smokeless tobacco: Never  Vaping Use   Vaping status: Never Used  Substance Use Topics   Alcohol use: Never   Drug use: Never     Allergies   Aspirin   Review of Systems Review of Systems Per HPI  Physical Exam Triage Vital Signs ED Triage Vitals  Encounter Vitals Group     BP 09/03/23 1635 121/77     Systolic BP Percentile --      Diastolic BP Percentile --      Pulse Rate 09/03/23 1635 (!) 125     Resp 09/03/23 1635 16     Temp 09/03/23 1635 (!) 100.8 F (38.2 C)     Temp Source 09/03/23 1635 Oral     SpO2 09/03/23 1635 94 %     Weight --      Height --      Head Circumference --      Peak Flow --      Pain Score  09/03/23 1638 8     Pain Loc --      Pain Education --      Exclude from Growth Chart --    No data found.  Updated Vital Signs BP 121/77 (BP Location: Right Arm)   Pulse (!) 125   Temp (!) 100.8 F (38.2 C) (Oral)   Resp 16   SpO2 94%   Visual Acuity Right Eye Distance:   Left Eye Distance:   Bilateral Distance:    Right Eye Near:   Left Eye Near:    Bilateral Near:     Physical Exam Vitals and nursing note reviewed.  Constitutional:      General: She is not in acute distress.    Appearance: Normal appearance. She is not ill-appearing or toxic-appearing.  HENT:     Head: Normocephalic and atraumatic.     Right Ear: Tympanic membrane, ear canal and external ear normal.     Left Ear: Tympanic membrane, ear canal and external ear normal.     Nose: Congestion present. No rhinorrhea.     Mouth/Throat:     Mouth: Mucous membranes are moist.     Pharynx: Oropharynx is clear. Posterior oropharyngeal erythema present. No oropharyngeal exudate.  Eyes:     General: No scleral icterus.    Extraocular Movements: Extraocular movements intact.  Cardiovascular:     Rate  and Rhythm: Normal rate and regular rhythm.  Pulmonary:     Effort: Pulmonary effort is normal. No respiratory distress.     Breath sounds: Normal breath sounds. No wheezing, rhonchi or rales.  Abdominal:     General: Abdomen is flat. Bowel sounds are normal. There is no distension.     Palpations: Abdomen is soft.     Tenderness: There is no abdominal tenderness. There is no right CVA tenderness, left CVA tenderness or rebound.  Musculoskeletal:     Cervical back: Normal range of motion and neck supple.  Lymphadenopathy:     Cervical: No cervical adenopathy.  Skin:    General: Skin is warm and dry.     Coloration: Skin is not jaundiced or pale.     Findings: No erythema or rash.  Neurological:     Mental Status: She is alert and oriented to person, place, and time.  Psychiatric:        Behavior: Behavior is cooperative.      UC Treatments / Results  Labs (all labs ordered are listed, but only abnormal results are displayed) Labs Reviewed  POCT INFLUENZA A/B - Normal  POCT RAPID STREP A (OFFICE) - Normal    EKG   Radiology No results found.  Procedures Procedures (including critical care time)  Medications Ordered in UC Medications - No data to display  Initial Impression / Assessment and Plan / UC Course  I have reviewed the triage vital signs and the nursing notes.  Pertinent labs & imaging results that were available during my care of the patient were reviewed by me and considered in my medical decision making (see chart for details).   Patient is febrile tachycardic in triage, otherwise vital signs are stable.  1. Viral URI with cough Vitals and exam are reassuring No red flags Rapid strep negative, influenza and COVID test also negative Suspect viral etiology Supportive care discussed, start cough suppressant medication and pick up over-the-counter supportive medicines ER and return precautions discussed  The patient was given the opportunity to ask  questions.  All questions answered to their satisfaction.  The patient is in agreement to this plan.    Final Clinical Impressions(s) / UC Diagnoses   Final diagnoses:  Viral URI with cough     Discharge Instructions      You have a viral upper respiratory infection.  Symptoms should improve over the next week to 10 days.  If you develop chest pain or shortness of breath, go to the emergency room.  Rapid strep throat test and influenza test is negative today.  Some things that can make you feel better are: - Increased rest - Increasing fluid with water/sugar free electrolytes - Acetaminophen and ibuprofen as needed for fever/pain - Salt water gargling, chloraseptic spray and throat lozenges - OTC guaifenesin (Mucinex) 600 mg twice daily for congestion - Saline sinus flushes or a neti pot - Humidifying the air -Tessalon Perles every 8 hours as needed for dry cough      ED Prescriptions     Medication Sig Dispense Auth. Provider   benzonatate (TESSALON) 100 MG capsule Take 1 capsule (100 mg total) by mouth 3 (three) times daily as needed for cough. Do not take with alcohol or while operating or driving heavy machinery 21 capsule Valentino Nose, NP      PDMP not reviewed this encounter.   Valentino Nose, NP 09/03/23 1740
